# Patient Record
Sex: Female | Born: 1965 | Race: White | Hispanic: No | Marital: Married | Smoking: Never smoker
Health system: Southern US, Community
[De-identification: ages and names within clinical notes are randomized; demographics above are authoritative.]

## PROBLEM LIST (undated history)

## (undated) DIAGNOSIS — M722 Plantar fascial fibromatosis: Secondary | ICD-10-CM

## (undated) DIAGNOSIS — T884XXA Failed or difficult intubation, initial encounter: Secondary | ICD-10-CM

## (undated) DIAGNOSIS — M2609 Other specified anomalies of jaw size: Secondary | ICD-10-CM

## (undated) DIAGNOSIS — K900 Celiac disease: Secondary | ICD-10-CM

## (undated) HISTORY — DX: Plantar fascial fibromatosis: M72.2

## (undated) HISTORY — DX: Celiac disease: K90.0

## (undated) HISTORY — PX: OTHER SURGICAL HISTORY: SHX169

---

## 1999-01-04 ENCOUNTER — Other Ambulatory Visit: Admission: RE | Admit: 1999-01-04 | Discharge: 1999-01-04 | Payer: Self-pay | Admitting: Obstetrics and Gynecology

## 2000-05-22 ENCOUNTER — Other Ambulatory Visit: Admission: RE | Admit: 2000-05-22 | Discharge: 2000-05-22 | Payer: Self-pay | Admitting: Obstetrics and Gynecology

## 2001-04-24 ENCOUNTER — Other Ambulatory Visit: Admission: RE | Admit: 2001-04-24 | Discharge: 2001-04-24 | Payer: Self-pay | Admitting: Obstetrics and Gynecology

## 2001-11-20 ENCOUNTER — Inpatient Hospital Stay (HOSPITAL_COMMUNITY): Admission: AD | Admit: 2001-11-20 | Discharge: 2001-11-22 | Payer: Self-pay | Admitting: Obstetrics and Gynecology

## 2002-01-01 ENCOUNTER — Other Ambulatory Visit: Admission: RE | Admit: 2002-01-01 | Discharge: 2002-01-01 | Payer: Self-pay | Admitting: Obstetrics and Gynecology

## 2002-12-26 ENCOUNTER — Encounter: Admission: RE | Admit: 2002-12-26 | Discharge: 2002-12-26 | Payer: Self-pay | Admitting: Otolaryngology

## 2002-12-26 ENCOUNTER — Encounter: Payer: Self-pay | Admitting: Otolaryngology

## 2003-01-05 ENCOUNTER — Other Ambulatory Visit: Admission: RE | Admit: 2003-01-05 | Discharge: 2003-01-05 | Payer: Self-pay | Admitting: Obstetrics and Gynecology

## 2003-01-08 ENCOUNTER — Encounter (INDEPENDENT_AMBULATORY_CARE_PROVIDER_SITE_OTHER): Payer: Self-pay | Admitting: Specialist

## 2003-01-08 ENCOUNTER — Ambulatory Visit (HOSPITAL_BASED_OUTPATIENT_CLINIC_OR_DEPARTMENT_OTHER): Admission: RE | Admit: 2003-01-08 | Discharge: 2003-01-09 | Payer: Self-pay | Admitting: Otolaryngology

## 2004-01-13 ENCOUNTER — Other Ambulatory Visit: Admission: RE | Admit: 2004-01-13 | Discharge: 2004-01-13 | Payer: Self-pay | Admitting: Obstetrics and Gynecology

## 2004-12-12 ENCOUNTER — Other Ambulatory Visit: Admission: RE | Admit: 2004-12-12 | Discharge: 2004-12-12 | Payer: Self-pay | Admitting: Family Medicine

## 2006-02-02 ENCOUNTER — Other Ambulatory Visit: Admission: RE | Admit: 2006-02-02 | Discharge: 2006-02-02 | Payer: Self-pay | Admitting: Family Medicine

## 2007-04-12 ENCOUNTER — Other Ambulatory Visit: Admission: RE | Admit: 2007-04-12 | Discharge: 2007-04-12 | Payer: Self-pay | Admitting: Family Medicine

## 2008-07-20 ENCOUNTER — Other Ambulatory Visit: Admission: RE | Admit: 2008-07-20 | Discharge: 2008-07-20 | Payer: Self-pay | Admitting: Family Medicine

## 2009-03-26 ENCOUNTER — Encounter: Admission: RE | Admit: 2009-03-26 | Discharge: 2009-03-26 | Payer: Self-pay | Admitting: Family Medicine

## 2009-09-27 ENCOUNTER — Other Ambulatory Visit: Admission: RE | Admit: 2009-09-27 | Discharge: 2009-09-27 | Payer: Self-pay | Admitting: Family Medicine

## 2011-04-21 NOTE — Op Note (Signed)
NAME:  Mary Conrad, Mary Conrad                            ACCOUNT NO.:  [id number]   MEDICAL RECORD NO.:  [id number]                   PATIENT TYPE:  AMB   LOCATION:  DSC                                  FACILITY:  [facility]   PHYSICIAN:  Suzanna Obey, M.D.                    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   OPERATION PERFORMED:  Left tympanomastoidectomy with canal wall up and  facial nerve monitor.   SURGEON:  Suzanna Obey, M.D.   ANESTHESIA:  General endotracheal tube.   ESTIMATED BLOOD LOSS:  Less than 10 cc.   INDICATIONS FOR PROCEDURE:  The patient is a 45 year old who has had  problems with a retraction pocket in the left attic which has been cleaned  but had a pocket that was too deep to really adequately clean and  appropriately for a number of visits and the patient now appears to need an  operation to remove this cystic sac of increasing debris.  She has also had  some decrease in her hearing.  She had a CT scan which showed evidence of a  cholesteatoma in the attic and Prussak space.  She was informed of the risks  and benefits of the procedure including bleeding, infection, facial nerve  paralysis, hearing loss, vertigo, recurrence, need for additional surgery,  and risks of the anesthetic.  All questions were answered and consent was  obtained.   DESCRIPTION OF PROCEDURE:  The patient was taken to the operating room and  placed in supine position.  After adequate general endotracheal tube  anesthesia, the facial nerve monitor was positioned and calibrated which had  low impedance.  She was prepped and draped in the usual sterile manner.  A  postauricular incision and canal were injected with 1% lidocaine with  1:100,000 epinephrine and  then 2% with 1:50,000 was injected into the canal  skin.  The attic retraction pocket was easily visualized with a lot of  debris.  The attic area was curetted to try to attempt to gain visualization  of the cholesteatoma which it went deeper than even a moderate amount of  curetting of the attic and posterior canal wall was able to visualize.  The  6 o'clock 12 o'clock incision was made with a sickle knife and then a Beaver  blade for the tympanomeatal flap which was raised into the middle ear.  The  middle ear was then opened and the chorda tympani could easily be seen.  The  incudostapedial joint was seen which was intact.  The cholesteatoma was all  up around the body of the incus and malleus head.  The mastoidectomy was  then performed dissecting down with the bone cutting burs and thinning out  the tegmen and the canal wall.  The antrum was entered and carefully  dissection was performed to not disturb the fossa incudis.  Once the antrum  was opened, you could easily see the sac of the cholesteatoma into the  mastoid cavity.  The attic area was thinned out and open so access could be  gained of the sac superiorly.  The sac was then teased out both from the  canal wall side and the mastoid side removing the cholesteatoma.  It  appeared that the malleus head as well as the incus were intact.  Movement  of the malleus manubrium showed movement of the incudostapedial joint.  Everything was irrigated and the mastoid cavity had no further evidence of  any epithelial debris.  The attic retraction area was covered with a fascia  graft that had been harvested from the temporalis muscle.  A piece of  Gelfoam was placed into the space to support the graft coverage and it was  laid down to the lateral process of the malleus and then up onto the canal  wall.  The tympanomeatal flap was then laid back down into its anatomic  position. Gelfoam was placed into the attic region from behind with  Ciprodex  and then Ciprodex soaked Gelfoam was placed on top of the tympanic membrane.  The postauricular incision was closed with interrupted 4-0 chromic and a  running 5-0 nylon.  The canal was further filled with Gelfoam and a cotton  ball and Glasscock dressing was placed.  The facial nerve monitor was never  discharged.  The patient was then awakened and brought to recovery in stable  condition.  Counts correct.                                                Suzanna Obey, M.D.    Cordelia Pen  D:  01/08/2003  T:  01/08/2003  Job:  [id number]

## 2011-04-21 NOTE — Op Note (Signed)
[facility] [place]  PatientMALIK, Mary Conrad Visit Number: [id number] MRN: [id number]          Service Type: OBS Location: 910D 9123 01 Attending Physician:  Lenoard Aden Dictated by:   Lenoard Aden, M.D. Proc. Date: 11/21/01 Admit Date:  11/20/2001                             Operative Report  INDICATIONS:                  Persistent fetal tachycardia.  PROCEDURE:                    After noting fetal tachycardia persistent 180-200 beat per minute range greater than 10 minutes, fetal vertex +4/+5 station, outlet vacuum assisted vaginal delivery with Mityvac mushroom cup with two pulls over central median episiotomy for a full-term living female. Apgars 8 and 9.  Patient and husband consented risks and benefits of operative vaginal delivery prior to procedure.  They acknowledge and wish to proceed. Therefore, delivery as noted.  No cervical or vaginal lacerations noted.  Bulb suctioning on the perineum.  Placenta delivered spontaneously and intact. Three vessel cord noted.  Estimated blood loss 500 cc.  Repair with a 3-0 Vicryl Rapide in a standard fashion.  Mother and baby doing well.  No complications. Dictated by:   Lenoard Aden, M.D. Attending Physician:  Lenoard Aden DD:  11/21/01 TD:  11/21/01 Job: [id number]

## 2011-04-21 NOTE — H&P (Signed)
[facility]  PatientSU, DUMA Visit Number: [id number] MRN: [id number]          Service Type: Attending:  Lenoard Aden, M.D. Dictated by:   Lenoard Aden, M.D. Adm. Date:  11/20/01                           History and Physical  CHIEF COMPLAINT:              Probable macrosomia, history of precipitous labor.  HISTORY OF PRESENT ILLNESS:   [age]-year-old white female, G4, P2, who presents at 39 weeks for elective induction due to aforementioned indications.  MEDICATIONS:                  Prenatal vitamins.  ALLERGIES:                    GLUTEN.  PAST OBSTETRICAL HISTORY:     SAB in 61, 6-pound 8-ounce female in 19, 5-pound 12-ounce female in 1997.  PAST MEDICAL HISTORY:         Patient has no other medical or surgical hospitalizations, history of celiac disease.  FAMILY HISTORY:               Family history of lymphedema, coronary vascular disease, insulin-dependent diabetes.  PRENATAL LABORATORY DATA:     Blood type O-positive, Rh-antibody negative, rubella immune, hepatitis B surface antigen negative, HIV nonreactive, GC and Chlamydia negative, group B strep negative.  PHYSICAL EXAMINATION:  GENERAL:                      Well-developed, well-nourished white female in no apparent distress.  HEENT:                        Normal.  LUNGS:                        Clear.  HEART:                        Regular rhythm.  ABDOMEN:                      Soft, gravid, nontender.  Estimated fetal weight 8 to 8-1/2 pounds.  PELVIC:                       Cervix is 2 to 3 cm, 50%, vertex and -1.  EXTREMITIES:                  No cords.  NEUROLOGIC:                   Exam is nonfocal.  IMPRESSION:                   1. Thirty-nine-week intrauterine pregnancy.                               2. Large for gestational age.                               3. History of precipitous labor.  PLAN:  Proceed  with induction, anticipate attempts at vaginal delivery. Dictated by:   Lenoard Aden, M.D. Attending:  Lenoard Aden, M.D. DD:  11/19/01 TD:  11/20/01 Job: [id number]

## 2011-09-07 ENCOUNTER — Other Ambulatory Visit: Payer: Self-pay | Admitting: Otolaryngology

## 2011-09-07 DIAGNOSIS — H7190 Unspecified cholesteatoma, unspecified ear: Secondary | ICD-10-CM

## 2011-09-13 ENCOUNTER — Ambulatory Visit
Admission: RE | Admit: 2011-09-13 | Discharge: 2011-09-13 | Disposition: A | Discharge status: Home / Self Care | Payer: No Typology Code available for payment source | Source: Ambulatory Visit | Attending: Otolaryngology | Admitting: Otolaryngology

## 2011-09-13 DIAGNOSIS — H7190 Unspecified cholesteatoma, unspecified ear: Secondary | ICD-10-CM

## 2011-09-13 MED ORDER — IOHEXOL 300 MG/ML  SOLN
75.0000 mL | Freq: Once | INTRAMUSCULAR | Status: AC | PRN
  Administered 2011-09-13: 75 mL via INTRAVENOUS

## 2011-11-29 ENCOUNTER — Other Ambulatory Visit: Payer: Self-pay | Admitting: Otolaryngology

## 2013-05-21 ENCOUNTER — Other Ambulatory Visit (HOSPITAL_COMMUNITY)
Admission: RE | Admit: 2013-05-21 | Discharge: 2013-05-21 | Disposition: A | Discharge status: Home / Self Care | Payer: BC Managed Care – PPO | Source: Ambulatory Visit | Attending: Family Medicine | Admitting: Family Medicine

## 2013-05-21 ENCOUNTER — Other Ambulatory Visit: Payer: Self-pay | Admitting: Family Medicine

## 2013-05-21 DIAGNOSIS — Z0000 Encounter for general adult medical examination without abnormal findings: Secondary | ICD-10-CM | POA: Insufficient documentation

## 2013-06-09 ENCOUNTER — Other Ambulatory Visit: Payer: Self-pay | Admitting: Radiology

## 2013-06-16 ENCOUNTER — Other Ambulatory Visit: Payer: Self-pay | Admitting: Radiology

## 2015-03-30 ENCOUNTER — Ambulatory Visit (INDEPENDENT_AMBULATORY_CARE_PROVIDER_SITE_OTHER): Payer: BC Managed Care – PPO | Admitting: Internal Medicine

## 2015-03-30 VITALS — BP 120/80 | HR 91 | Temp 98.3°F | Ht 66.25 in | Wt 181.4 lb

## 2015-03-30 DIAGNOSIS — K047 Periapical abscess without sinus: Secondary | ICD-10-CM

## 2015-03-30 MED ORDER — AMOXICILLIN 500 MG PO CAPS: 1000.0000 mg | ORAL_CAPSULE | Freq: Two times a day (BID) | ORAL | Status: AC

## 2015-03-30 NOTE — Progress Notes (Signed)
   Subjective:    Patient ID: Mary Conrad, female    DOB: [birth year], 49 y.o.   MRN: [id number]  This chart was scribed for Tonye Pearsonobert P Kyland No, MD by Ronney LionSuzanne Le, ED Scribe. This patient was seen in room 10 and the patient's care was started at 8:18 PM.   HPI  Chief Complaint  Patient presents with  . Sinus Pressure  . Ear Pain    Left     HPI Comments: Mary Conrad is a 49 y.o. female who presents to the [facility] complaining of left sided sinus pressure and left otalgia. She also complains of lower left dental pain. Patient has a history of left cholesteatoma and wonders if that is the cause of her symptoms. She denies SOB, nasal congestion, or sleep disturbances from the pain.   Past Medical History  Diagnosis Date  . Celiac disease    Past surgical history of repair of cholesteatoma Prior to Admission medications   Medication Sig Start Date End Date Taking? Authorizing Provider  levonorgestrel-ethinyl estradiol (SEASONALE,INTROVALE,JOLESSA) 0.15-0.03 MG tablet Take 1 tablet by mouth daily.   Yes Historical Provider, MD   Not on File  Review of Systems  HENT: Negative for congestion.   Respiratory: Negative for shortness of breath.    no history of allergic rhinitis     Objective:   Physical Exam  Constitutional: She is oriented to person, place, and time. She appears well-developed and well-nourished. No distress.  HENT:  Right Ear: External ear normal.  Nose: Nose normal.  Left tympanic membrane is very scarred from surgeries related to her cholesteatoma but there is no sign of infection or some collection of fluid The left lower molar has very tender swelling at the root and pressure with the submandibular area is also tender with slight swelling in the submandibular node. Biting down on that last molar hurts.  Eyes: Conjunctivae and EOM are normal. Pupils are equal, round, and reactive to light.  Neck: Neck supple. No thyromegaly present.    Cardiovascular: Normal rate.   Pulmonary/Chest: Effort normal.  Lymphadenopathy:    She has no cervical adenopathy.  Neurological: She is alert and oriented to person, place, and time. No cranial nerve deficit.  Psychiatric: She has a normal mood and affect.  Nursing note and vitals reviewed.  BP 120/80 mmHg  Pulse 91  Temp(Src) 98.3 F (36.8 C) (Oral)  Ht 5' 6.25" (1.683 m)  Wt 181 lb 6 oz (82.271 kg)  BMI 29.05 kg/m2  SpO2 97%        Assessment & Plan:  Dental abscess   Meds ordered this encounter  Medications  . amoxicillin (AMOXIL) 500 MG capsule    Sig: Take 2 capsules (1,000 mg total) by mouth 2 (two) times daily.    Dispense:  40 capsule    Refill:  0   She will follow-up with her dentist in the next few weeks  I have completed the patient encounter in its entirety as documented by the scribe, with editing by me where necessary. Rooney Gladwin P. Merla Richesoolittle, M.D.

## 2015-11-11 ENCOUNTER — Other Ambulatory Visit: Payer: Self-pay | Admitting: Obstetrics and Gynecology

## 2015-11-11 ENCOUNTER — Encounter (HOSPITAL_COMMUNITY): Payer: Self-pay | Admitting: *Deleted

## 2015-11-12 ENCOUNTER — Encounter (HOSPITAL_COMMUNITY)
Admission: RE | Disposition: A | Discharge status: Home / Self Care | Payer: Self-pay | Source: Ambulatory Visit | Attending: Obstetrics and Gynecology

## 2015-11-12 ENCOUNTER — Ambulatory Visit (HOSPITAL_COMMUNITY)
Admission: RE | Admit: 2015-11-12 | Discharge: 2015-11-12 | Disposition: A | Discharge status: Home / Self Care | Payer: BC Managed Care – PPO | Source: Ambulatory Visit | Attending: Obstetrics and Gynecology | Admitting: Obstetrics and Gynecology

## 2015-11-12 ENCOUNTER — Ambulatory Visit (HOSPITAL_COMMUNITY): Payer: BC Managed Care – PPO | Admitting: Anesthesiology

## 2015-11-12 ENCOUNTER — Encounter (HOSPITAL_COMMUNITY): Payer: Self-pay | Admitting: Anesthesiology

## 2015-11-12 DIAGNOSIS — N920 Excessive and frequent menstruation with regular cycle: Secondary | ICD-10-CM | POA: Diagnosis present

## 2015-11-12 HISTORY — DX: Other specified anomalies of jaw size: M26.09

## 2015-11-12 HISTORY — PX: DILITATION & CURRETTAGE/HYSTROSCOPY WITH NOVASURE ABLATION: SHX5568

## 2015-11-12 HISTORY — DX: Failed or difficult intubation, initial encounter: T88.4XXA

## 2015-11-12 LAB — CBC
HCT: 41 % (ref 36.0–46.0)
Hemoglobin: 13.8 g/dL (ref 12.0–15.0)
MCH: 30.3 pg (ref 26.0–34.0)
MCHC: 33.7 g/dL (ref 30.0–36.0)
MCV: 89.9 fL (ref 78.0–100.0)
PLATELETS: 331 10*3/uL (ref 150–400)
RBC: 4.56 MIL/uL (ref 3.87–5.11)
RDW: 13.5 % (ref 11.5–15.5)
WBC: 8.7 10*3/uL (ref 4.0–10.5)

## 2015-11-12 LAB — COMPREHENSIVE METABOLIC PANEL
ALBUMIN: 4.3 g/dL (ref 3.5–5.0)
ALT: 23 U/L (ref 14–54)
AST: 24 U/L (ref 15–41)
Alkaline Phosphatase: 79 U/L (ref 38–126)
Anion gap: 11 (ref 5–15)
BUN: 10 mg/dL (ref 6–20)
CHLORIDE: 100 mmol/L — AB (ref 101–111)
CO2: 22 mmol/L (ref 22–32)
CREATININE: 0.44 mg/dL (ref 0.44–1.00)
Calcium: 8.7 mg/dL — ABNORMAL LOW (ref 8.9–10.3)
GFR calc Af Amer: >60 mL/min (ref >60)
GLUCOSE: 86 mg/dL (ref 65–99)
Potassium: 3.7 mmol/L (ref 3.5–5.1)
Sodium: 133 mmol/L — ABNORMAL LOW (ref 135–145)
Total Bilirubin: 0.4 mg/dL (ref 0.3–1.2)
Total Protein: 7.8 g/dL (ref 6.5–8.1)

## 2015-11-12 LAB — HCG, SERUM, QUALITATIVE: Preg, Serum: NEGATIVE

## 2015-11-12 SURGERY — DILATATION & CURETTAGE/HYSTEROSCOPY WITH NOVASURE ABLATION
Anesthesia: General

## 2015-11-12 MED ORDER — SCOPOLAMINE 1 MG/3DAYS TD PT72
MEDICATED_PATCH | TRANSDERMAL | Status: AC
  Administered 2015-11-12: 1.5 mg via TRANSDERMAL
  Filled 2015-11-12: qty 1

## 2015-11-12 MED ORDER — LIDOCAINE HCL (CARDIAC) 20 MG/ML IV SOLN
INTRAVENOUS | Status: AC
  Filled 2015-11-12: qty 5

## 2015-11-12 MED ORDER — PROPOFOL 10 MG/ML IV BOLUS
INTRAVENOUS | Status: AC
Start: 2015-11-12 — End: 2015-11-12
  Filled 2015-11-12: qty 20

## 2015-11-12 MED ORDER — BUPIVACAINE HCL (PF) 0.25 % IJ SOLN
INTRAMUSCULAR | Status: DC | PRN
  Administered 2015-11-12: 20 mL

## 2015-11-12 MED ORDER — FENTANYL CITRATE (PF) 100 MCG/2ML IJ SOLN: 25.0000 ug | INTRAMUSCULAR | Status: DC | PRN

## 2015-11-12 MED ORDER — KETOROLAC TROMETHAMINE 30 MG/ML IJ SOLN
INTRAMUSCULAR | Status: DC | PRN
  Administered 2015-11-12: 30 mg via INTRAVENOUS

## 2015-11-12 MED ORDER — SODIUM CHLORIDE 0.9 % IJ SOLN
INTRAMUSCULAR | Status: AC
  Filled 2015-11-12: qty 50

## 2015-11-12 MED ORDER — CEFAZOLIN SODIUM-DEXTROSE 2-3 GM-% IV SOLR
INTRAVENOUS | Status: AC
  Filled 2015-11-12: qty 50

## 2015-11-12 MED ORDER — HYDROCODONE-IBUPROFEN 7.5-200 MG PO TABS: 1.0000 | ORAL_TABLET | Freq: Three times a day (TID) | ORAL | Status: DC | PRN

## 2015-11-12 MED ORDER — HYDROCODONE-ACETAMINOPHEN 7.5-325 MG PO TABS
1.0000 | ORAL_TABLET | Freq: Once | ORAL | Status: AC | PRN
  Administered 2015-11-12: 1 via ORAL

## 2015-11-12 MED ORDER — FENTANYL CITRATE (PF) 100 MCG/2ML IJ SOLN
INTRAMUSCULAR | Status: AC
  Filled 2015-11-12: qty 2

## 2015-11-12 MED ORDER — ONDANSETRON HCL 4 MG/2ML IJ SOLN
INTRAMUSCULAR | Status: AC
  Filled 2015-11-12: qty 2

## 2015-11-12 MED ORDER — GLYCOPYRROLATE 0.2 MG/ML IJ SOLN
INTRAMUSCULAR | Status: AC
  Filled 2015-11-12: qty 1

## 2015-11-12 MED ORDER — LACTATED RINGERS IV SOLN
INTRAVENOUS | Status: DC
  Administered 2015-11-12 (×2): via INTRAVENOUS

## 2015-11-12 MED ORDER — MIDAZOLAM HCL 5 MG/5ML IJ SOLN
INTRAMUSCULAR | Status: DC | PRN
  Administered 2015-11-12: 2 mg via INTRAVENOUS

## 2015-11-12 MED ORDER — DEXAMETHASONE SODIUM PHOSPHATE 4 MG/ML IJ SOLN
INTRAMUSCULAR | Status: DC | PRN
  Administered 2015-11-12: 10 mg via INTRAVENOUS

## 2015-11-12 MED ORDER — CHLOROPROCAINE HCL 1 % IJ SOLN
INTRAMUSCULAR | Status: AC
  Filled 2015-11-12: qty 30

## 2015-11-12 MED ORDER — LIDOCAINE HCL (CARDIAC) 20 MG/ML IV SOLN
INTRAVENOUS | Status: DC | PRN
  Administered 2015-11-12: 80 mg via INTRAVENOUS

## 2015-11-12 MED ORDER — CEFAZOLIN SODIUM-DEXTROSE 2-3 GM-% IV SOLR
2.0000 g | INTRAVENOUS | Status: AC
  Administered 2015-11-12: 2 g via INTRAVENOUS

## 2015-11-12 MED ORDER — ONDANSETRON HCL 4 MG/2ML IJ SOLN
INTRAMUSCULAR | Status: DC | PRN
  Administered 2015-11-12: 4 mg via INTRAVENOUS

## 2015-11-12 MED ORDER — METOCLOPRAMIDE HCL 5 MG/ML IJ SOLN: 10.0000 mg | Freq: Once | INTRAMUSCULAR | Status: DC | PRN

## 2015-11-12 MED ORDER — BUPIVACAINE HCL (PF) 0.25 % IJ SOLN
INTRAMUSCULAR | Status: AC
  Filled 2015-11-12: qty 30

## 2015-11-12 MED ORDER — BUPIVACAINE HCL (PF) 0.25 % IJ SOLN
INTRAMUSCULAR | Status: AC
Start: 2015-11-12 — End: 2015-11-12
  Filled 2015-11-12: qty 10

## 2015-11-12 MED ORDER — SODIUM CHLORIDE 0.9 % IR SOLN
Status: DC | PRN
  Administered 2015-11-12: 600 mL

## 2015-11-12 MED ORDER — PROPOFOL 10 MG/ML IV BOLUS
INTRAVENOUS | Status: DC | PRN
  Administered 2015-11-12: 180 mg via INTRAVENOUS

## 2015-11-12 MED ORDER — MIDAZOLAM HCL 2 MG/2ML IJ SOLN
INTRAMUSCULAR | Status: AC
  Filled 2015-11-12: qty 2

## 2015-11-12 MED ORDER — SCOPOLAMINE 1 MG/3DAYS TD PT72
1.0000 | MEDICATED_PATCH | Freq: Once | TRANSDERMAL | Status: DC
  Administered 2015-11-12: 1.5 mg via TRANSDERMAL

## 2015-11-12 MED ORDER — HYDROCODONE-ACETAMINOPHEN 7.5-325 MG PO TABS
ORAL_TABLET | ORAL | Status: AC
  Filled 2015-11-12: qty 1

## 2015-11-12 MED ORDER — MEPERIDINE HCL 25 MG/ML IJ SOLN: 6.2500 mg | INTRAMUSCULAR | Status: DC | PRN

## 2015-11-12 MED ORDER — VASOPRESSIN 20 UNIT/ML IV SOLN
INTRAVENOUS | Status: AC
  Filled 2015-11-12: qty 1

## 2015-11-12 MED ORDER — DEXAMETHASONE SODIUM PHOSPHATE 10 MG/ML IJ SOLN
INTRAMUSCULAR | Status: AC
  Filled 2015-11-12: qty 1

## 2015-11-12 MED ORDER — FENTANYL CITRATE (PF) 100 MCG/2ML IJ SOLN
INTRAMUSCULAR | Status: DC | PRN
  Administered 2015-11-12: 100 ug via INTRAVENOUS

## 2015-11-12 SURGICAL SUPPLY — 18 items
ABLATOR ENDOMETRIAL BIPOLAR (ABLATOR) ×3 IMPLANT
CATH ROBINSON RED A/P 16FR (CATHETERS) ×3 IMPLANT
CLOTH BEACON ORANGE TIMEOUT ST (SAFETY) ×3 IMPLANT
CONTAINER PREFILL 10% NBF 60ML (FORM) IMPLANT
GLOVE BIO SURGEON STRL SZ7.5 (GLOVE) ×3 IMPLANT
GLOVE BIOGEL PI IND STRL 7.0 (GLOVE) ×1 IMPLANT
GLOVE BIOGEL PI INDICATOR 7.0 (GLOVE) ×2
GOWN STRL REUS W/TWL LRG LVL3 (GOWN DISPOSABLE) ×6 IMPLANT
PACK VAGINAL MINOR WOMEN LF (CUSTOM PROCEDURE TRAY) ×3 IMPLANT
PAD OB MATERNITY 4.3X12.25 (PERSONAL CARE ITEMS) ×3 IMPLANT
PAD PREP 24X48 CUFFED NSTRL (MISCELLANEOUS) ×3 IMPLANT
SUT VIC AB 2-0 SH 27 (SUTURE) ×2
SUT VIC AB 2-0 SH 27XBRD (SUTURE) ×1 IMPLANT
SYR TB 1ML 25GX5/8 (SYRINGE) ×3 IMPLANT
TOWEL OR 17X24 6PK STRL BLUE (TOWEL DISPOSABLE) ×6 IMPLANT
TUBING AQUILEX INFLOW (TUBING) ×3 IMPLANT
TUBING AQUILEX OUTFLOW (TUBING) ×3 IMPLANT
WATER STERILE IRR 1000ML POUR (IV SOLUTION) ×3 IMPLANT

## 2015-11-12 NOTE — Transfer of Care (Signed)
Immediate Anesthesia Transfer of Care Note  Patient: Mary Conrad  Procedure(s) Performed: Procedure(s): DILATATION & CURETTAGE/HYSTEROSCOPY WITH NOVASURE ABLATION (N/A)  Patient Location: PACU  Anesthesia Type:General  Level of Consciousness: awake, alert  and oriented  Airway & Oxygen Therapy: Patient Spontanous Breathing and Patient connected to nasal cannula oxygen  Post-op Assessment: Report given to RN and Post -op Vital signs reviewed and stable  Post vital signs: Reviewed and stable  Last Vitals:  Filed Vitals:   11/12/15 1347  BP: 139/90  Pulse: 93  Temp: 36.7 C  Resp: 18    Complications: No apparent anesthesia complications

## 2015-11-12 NOTE — Anesthesia Postprocedure Evaluation (Signed)
Anesthesia Post Note  Patient: Mary Conrad  Procedure(s) Performed: Procedure(s) (LRB): DILATATION & CURETTAGE/HYSTEROSCOPY WITH NOVASURE ABLATION (N/A)  Patient location during evaluation: PACU Anesthesia Type: General Level of consciousness: awake and alert and oriented Pain management: pain level controlled Vital Signs Assessment: post-procedure vital signs reviewed and stable Respiratory status: spontaneous breathing, nonlabored ventilation and respiratory function stable Cardiovascular status: blood pressure returned to baseline and stable Postop Assessment: no signs of nausea or vomiting Anesthetic complications: no    Last Vitals:  Filed Vitals:   11/12/15 1515 11/12/15 1530  BP: 128/74 120/76  Pulse:  76  Temp:    Resp:  11    Last Pain:  Filed Vitals:   11/12/15 1540  PainSc: 2                  [name] A.

## 2015-11-12 NOTE — Discharge Instructions (Signed)

## 2015-11-12 NOTE — H&P (Signed)
NAMMorrie Sheldon:  Streeter, Jyoti                  ACCOUNT NO.:  [id number]  MEDICAL RECORD NO.:  [id number]  LOCATION:  PERIO                         FACILITY:  [facility]  PHYSICIAN:  Lenoard Adenichard J. Kaity Pitstick, M.D.DATE OF BIRTH:  1966/04/18  DATE OF ADMISSION:  11/11/2015 DATE OF DISCHARGE:                             HISTORY & PHYSICAL   CHIEF COMPLAINT:  Refractory menorrhagia.  HISTORY OF PRESENT ILLNESS:  A 49 year old white female, G4, P3, who presents for diagnostic hysteroscopy, D and C, NovaSure ablation.  The patient is scheduled for office procedure, was determined to have a difficult airway and scheduled at the hospital.  She has family history of heart disease, diabetes, hypertension, and lung and skin cancer.  PERSONAL HISTORY:  Celiac disease.  OB/GYN HISTORY:  History of vaginal delivery x3.  Miscarriage x1.  PAST SURGICAL HISTORY:  History of ear surgery.  ALLERGIES:  She has no known drug allergies.  MEDICATIONS:  Birth control pills, Flonase, and ibuprofen as needed.  PHYSICAL EXAMINATION:  GENERAL:  She is a well-developed, well- nourished, white female, in no acute distress. HEENT:  Normal. NECK:  Supple.  Full range of motion. LUNGS:  Clear. HEART:  Regular rate and rhythm. ABDOMEN:  Soft, nontender. PELVIC:  Reveals a normal sized anteflexed uterus and no adnexal masses. EXTREMITIES:  There are no cords. NEUROLOGIC:  Nonfocal. SKIN:  Intact.  IMPRESSION:  Refractory menorrhagia for definitive therapy.  PLAN:  Proceed with diagnostic hysteroscopy, D and C, NovaSure endometrial ablation, risks of anesthesia, infection, bleeding, injury to surrounding organs with possible need for repair discussed.  Delayed versus immediate complications to include bowel and bladder injury noted.  The patient acknowledges and wishes to proceed.     Lenoard Adenichard J. Antoinette Haskett, M.D.     RJT/MEDQ  D:  11/12/2015  T:  11/12/2015  Job:  [id number]

## 2015-11-12 NOTE — Anesthesia Procedure Notes (Signed)
Procedure Name: LMA Insertion Date/Time: 11/12/2015 2:38 PM Performed by: Olivia MackieAAVON, RICHARD Pre-anesthesia Checklist: Patient identified, Emergency Drugs available, Suction available and Patient being monitored Patient Re-evaluated:Patient Re-evaluated prior to inductionOxygen Delivery Method: Circle system utilized Preoxygenation: Pre-oxygenation with 100% oxygen Intubation Type: IV induction Ventilation: Mask ventilation without difficulty Grade View: Grade I Number of attempts: 1 Placement Confirmation: breath sounds checked- equal and bilateral

## 2015-11-12 NOTE — Op Note (Signed)
11/12/2015  3:06 PM  PATIENT:  Mary HeadAmanda S Conrad  49 y.o. female  PRE-OPERATIVE DIAGNOSIS:  Menorrhagia  POST-OPERATIVE DIAGNOSIS:  Menorrhagia  PROCEDURE:  Procedure(s): DILATATION & CURETTAGE/HYSTEROSCOPY WITH NOVASURE ABLATION  SURGEON:  Surgeon(s): Olivia Mackieichard Casimira Sutphin, MD  ASSISTANTS: none   ANESTHESIA:   local and general  ESTIMATED BLOOD LOSS: minimal Fluid Deficit: 60cc  DRAINS: none   LOCAL MEDICATIONS USED:  MARCAINE    and Amount: 20 ml  SPECIMEN:  Source of Specimen:  [facility]  DISPOSITION OF SPECIMEN:  PATHOLOGY  COUNTS:  YES  DICTATION #: [id number]  PLAN OF CARE: DC home  PATIENT DISPOSITION:  PACU - hemodynamically stable.

## 2015-11-12 NOTE — Anesthesia Preprocedure Evaluation (Addendum)
Anesthesia Evaluation  Patient identified by MRN, date of birth, ID band Patient awake    Reviewed: Allergy & Precautions, NPO status , Patient's Chart, lab work & pertinent test results  History of Anesthesia Complications (+) DIFFICULT AIRWAY and history of anesthetic complications  Airway Mallampati: III  TM Distance: >3 FB Neck ROM: Full    Dental no notable dental hx. (+) Teeth Intact   Pulmonary neg pulmonary ROS,    Pulmonary exam normal breath sounds clear to auscultation       Cardiovascular negative cardio ROS Normal cardiovascular exam Rhythm:Regular Rate:Normal     Neuro/Psych negative neurological ROS  negative psych ROS   GI/Hepatic Neg liver ROS, Celiac disease   Endo/Other  negative endocrine ROS  Renal/GU negative Renal ROS  negative genitourinary   Musculoskeletal negative musculoskeletal ROS (+)   Abdominal   Peds  Hematology negative hematology ROS (+)   Anesthesia Other Findings Hx/o Difficult intubation in past  Reproductive/Obstetrics Menorrhagia                           Anesthesia Physical Anesthesia Plan  ASA: II  Anesthesia Plan: General   Post-op Pain Management:    Induction: Intravenous  Airway Management Planned: LMA  Additional Equipment:   Intra-op Plan:   Post-operative Plan: Extubation in OR  Informed Consent: I have reviewed the patients History and Physical, chart, labs and discussed the procedure including the risks, benefits and alternatives for the proposed anesthesia with the patient or authorized representative who has indicated his/her understanding and acceptance.   Dental advisory given  Plan Discussed with: CRNA, Anesthesiologist and Surgeon  Anesthesia Plan Comments:         Anesthesia Quick Evaluation

## 2015-11-12 NOTE — Progress Notes (Signed)
Patient ID: Mary Conrad, female   DOB: Jan 28, 1966, 49 y.o.   MRN: [id number] Patient seen and examined. Consent witnessed and signed. No changes noted. Update completed.

## 2015-11-13 NOTE — Op Note (Signed)
NAMMorrie Sheldon:  Conrad, Mary                  ACCOUNT NO.:  [id number]  MEDICAL RECORD NO.:  [id number]  LOCATION:  [facility]                          FACILITY:  [facility]  PHYSICIAN:  Lenoard Adenichard J. Raeya Merritts, M.D.DATE OF BIRTH:  23-Nov-1966  DATE OF PROCEDURE: DATE OF DISCHARGE:  11/12/2015                              OPERATIVE REPORT   PREOPERATIVE DIAGNOSIS:  Refractory menorrhagia.  POSTOPERATIVE DIAGNOSIS:  Refractory menorrhagia.  PROCEDURE:  Diagnostic hysteroscopy, D and C, NovaSure endometrial ablation.  SURGEON:  Lenoard Adenichard J. Corri Delapaz, MD  ASSISTANT:  None.  ANESTHESIA:  General and local.  ESTIMATED BLOOD LOSS:  Less than 50 mL.  FLUID DEFICIT:  60 mL.  COMPLICATIONS:  None.  DRAINS:  None.  COUNTS:  Correct.  DISPOSITION:  The patient to recovery in good condition.  BRIEF OPERATIVE NOTE:  After being apprised of risks of anesthesia, infection, bleeding, and the surrounding organs, possible need for repair, delayed versus immediate complications to include bowel and bladder injury, possible need for repair, the patient was brought to the operating room where she was administered general anesthetic without complications, prepped and draped in usual sterile fashion. Catheterized until the bladder was empty.  Feet were placed in Yellofin stirrups.  Exam under anesthesia revealed a small anteflexed uterus.  No adnexal masses.  Dilute Marcaine solution used for used for standard paracervical block.  Cervix easily dilated up to a 19 Pratt dilator. Hysteroscope placed.  Visualization reveals a normal endometrial cavity. D and C, performed using sharp curettage in a 4-quadrant method. NovaSure device placed seated to a width of 3.8, a length of 6 initiated for 69 seconds after a negative CO2 test.  Procedure was then terminated.  NovaSure device was removed. Device was inspected, found to be intact. Revisualization in the uterine cavity reveals a well ablated cavity and no evidence of  perforation.  Patient tolerated the procedure well, awakened and transferred to recovery in good condition.     Lenoard Adenichard J. Amela Handley, M.D.     RJT/MEDQ  D:  11/12/2015  T:  11/13/2015  Job:  [id number]

## 2015-11-15 ENCOUNTER — Encounter (HOSPITAL_COMMUNITY): Payer: Self-pay | Admitting: Obstetrics and Gynecology

## 2019-01-22 ENCOUNTER — Other Ambulatory Visit (HOSPITAL_COMMUNITY)
Admission: RE | Admit: 2019-01-22 | Discharge: 2019-01-22 | Disposition: A | Discharge status: Home / Self Care | Payer: BC Managed Care – PPO | Source: Ambulatory Visit | Attending: Family Medicine | Admitting: Family Medicine

## 2019-01-22 ENCOUNTER — Other Ambulatory Visit: Payer: Self-pay | Admitting: Family Medicine

## 2019-01-22 DIAGNOSIS — Z0000 Encounter for general adult medical examination without abnormal findings: Secondary | ICD-10-CM | POA: Diagnosis present

## 2019-01-24 LAB — CYTOLOGY - PAP
DIAGNOSIS: NEGATIVE
HPV (WINDOPATH): NOT DETECTED

## 2019-01-27 ENCOUNTER — Other Ambulatory Visit: Payer: Self-pay | Admitting: Family Medicine

## 2019-01-27 DIAGNOSIS — N938 Other specified abnormal uterine and vaginal bleeding: Secondary | ICD-10-CM

## 2019-02-10 ENCOUNTER — Ambulatory Visit
Admission: RE | Admit: 2019-02-10 | Discharge: 2019-02-10 | Disposition: A | Discharge status: Home / Self Care | Payer: BC Managed Care – PPO | Source: Ambulatory Visit | Attending: Family Medicine | Admitting: Family Medicine

## 2019-02-10 DIAGNOSIS — N938 Other specified abnormal uterine and vaginal bleeding: Secondary | ICD-10-CM

## 2019-02-19 ENCOUNTER — Other Ambulatory Visit: Payer: Self-pay

## 2019-02-19 ENCOUNTER — Ambulatory Visit: Payer: BC Managed Care – PPO | Admitting: Podiatry

## 2019-02-19 ENCOUNTER — Other Ambulatory Visit: Payer: Self-pay | Admitting: Podiatry

## 2019-02-19 ENCOUNTER — Ambulatory Visit (INDEPENDENT_AMBULATORY_CARE_PROVIDER_SITE_OTHER): Payer: BC Managed Care – PPO

## 2019-02-19 ENCOUNTER — Encounter: Payer: Self-pay | Admitting: Podiatry

## 2019-02-19 VITALS — BP 98/60 | HR 74

## 2019-02-19 DIAGNOSIS — M722 Plantar fascial fibromatosis: Secondary | ICD-10-CM

## 2019-02-19 DIAGNOSIS — M79672 Pain in left foot: Secondary | ICD-10-CM

## 2019-02-19 DIAGNOSIS — M7662 Achilles tendinitis, left leg: Secondary | ICD-10-CM

## 2019-02-19 DIAGNOSIS — M79671 Pain in right foot: Secondary | ICD-10-CM

## 2019-02-19 DIAGNOSIS — M7661 Achilles tendinitis, right leg: Secondary | ICD-10-CM | POA: Diagnosis not present

## 2019-02-19 MED ORDER — TRIAMCINOLONE ACETONIDE 10 MG/ML IJ SUSP
10.0000 mg | Freq: Once | INTRAMUSCULAR | Status: AC
  Administered 2019-02-19: 10 mg

## 2019-02-19 MED ORDER — DICLOFENAC SODIUM 75 MG PO TBEC: 75.0000 mg | DELAYED_RELEASE_TABLET | Freq: Two times a day (BID) | ORAL | 2 refills | Status: DC

## 2019-02-19 NOTE — Progress Notes (Signed)
Subjective:   Patient ID: Mary Conrad, female   DOB: 53 y.o.   MRN: [id number]   HPI Patient presents with long-term history of discomfort in the plantar heel left over right that is intensified over the last few months.  States the left is worse and that she has tried to reduce her activity and has tried shoe gear modifications.  Patient does not smoke likes to be active   Review of Systems  All other systems reviewed and are negative.       Objective:  Physical Exam Vitals signs and nursing note reviewed.  Constitutional:      Appearance: She is well-developed.  Pulmonary:     Effort: Pulmonary effort is normal.  Musculoskeletal: Normal range of motion.  Skin:    General: Skin is warm.  Neurological:     Mental Status: She is alert.     Neurovascular status intact muscle strength is adequate range of motion within normal limits with patient found to have exquisite discomfort plantar aspect left heel at the insertional point tendon calcaneus with mild Achilles tendon symptoms and mild pain in the right plantar heel.  Patient was noted to have good digital perfusion well oriented x3     Assessment:  Acute plantar fasciitis left with inflammation fluid around the medial band with mild discomfort Achilles and right heel     Plan:  H&P x-rays reviewed and today I did sterile prep and injected the plantar fascial left 3 mg Kenalog 5 mg Xylocaine applied fascial brace placed on diclofenac and reappoint 2 weeks or earlier if needed.  Advised this patient on reduced activity and shoe gear modifications  X-rays indicated small spur with no indications of stress fracture arthritis

## 2019-02-19 NOTE — Patient Instructions (Signed)

## 2019-03-05 ENCOUNTER — Other Ambulatory Visit: Payer: Self-pay

## 2019-03-05 ENCOUNTER — Encounter: Payer: Self-pay | Admitting: Podiatry

## 2019-03-05 ENCOUNTER — Ambulatory Visit: Payer: BC Managed Care – PPO | Admitting: Podiatry

## 2019-03-05 VITALS — Temp 97.2°F

## 2019-03-05 DIAGNOSIS — M7661 Achilles tendinitis, right leg: Secondary | ICD-10-CM | POA: Diagnosis not present

## 2019-03-05 DIAGNOSIS — M722 Plantar fascial fibromatosis: Secondary | ICD-10-CM

## 2019-03-05 DIAGNOSIS — M7662 Achilles tendinitis, left leg: Secondary | ICD-10-CM | POA: Diagnosis not present

## 2019-03-05 NOTE — Patient Instructions (Signed)

## 2019-03-05 NOTE — Progress Notes (Signed)
Subjective:   Patient ID: Mary Conrad, female   DOB: 53 y.o.   MRN: [id number]   HPI Patient presents stating my heel feels some better but I have had recurrent episodes of this and I know I am getting need some kind of support long-term   ROS      Objective:  Physical Exam  Neurovascular status intact with patient found to have exquisite discomfort that is reduced quite a bit left plantar fascia with pain still noted upon deep palpation and a very thin heel noted upon evaluation     Assessment:  Plantar fasciitis improved but still present left     Plan:  Reviewed condition and long-term of recommended orthotics to lift up the arch take stress off the heel and try to prevent her from having further episodes.  Patient at this time is scanned for customized orthotics and will be seen back when ready.  Spent a great deal time going over with her plantar fasciitis and how to try to avoid future episodes especially with her type foot structure which I explained to her at great detail today

## 2019-03-27 ENCOUNTER — Ambulatory Visit: Payer: BC Managed Care – PPO | Admitting: Orthotics

## 2019-03-27 ENCOUNTER — Other Ambulatory Visit: Payer: Self-pay

## 2019-03-27 VITALS — Temp 97.1°F

## 2019-03-27 DIAGNOSIS — M7661 Achilles tendinitis, right leg: Secondary | ICD-10-CM

## 2019-03-27 DIAGNOSIS — M7662 Achilles tendinitis, left leg: Secondary | ICD-10-CM

## 2019-03-27 DIAGNOSIS — M79671 Pain in right foot: Secondary | ICD-10-CM

## 2019-03-27 NOTE — Progress Notes (Signed)
Patient came in today to pick up custom made foot orthotics.  The goals were accomplished and the patient reported no dissatisfaction with said orthotics.  Patient was advised of breakin period and how to report any issues. 

## 2019-10-15 DIAGNOSIS — H6982 Other specified disorders of Eustachian tube, left ear: Secondary | ICD-10-CM | POA: Insufficient documentation

## 2020-05-14 ENCOUNTER — Ambulatory Visit: Payer: BC Managed Care – PPO | Admitting: Podiatry

## 2020-05-21 ENCOUNTER — Other Ambulatory Visit: Payer: Self-pay

## 2020-05-21 ENCOUNTER — Encounter: Payer: Self-pay | Admitting: Podiatry

## 2020-05-21 ENCOUNTER — Ambulatory Visit: Payer: BC Managed Care – PPO | Admitting: Podiatry

## 2020-05-21 VITALS — HR 99

## 2020-05-21 DIAGNOSIS — M722 Plantar fascial fibromatosis: Secondary | ICD-10-CM

## 2020-05-23 NOTE — Progress Notes (Signed)
Subjective:   Patient ID: Mary Conrad, female   DOB: 54 y.o.   MRN: [id number]   HPI Patient states she is doing reasonably well but still having a lot of pain in her heel and its been very bothersome especially when she gets up in the morning and after periods of sitting.  Patient states that it does get frustrated   ROS      Objective:  Physical Exam  Neurovascular status intact with patient found to have exquisite discomfort plantar aspect left heel where she is only getting approximately 2-1/2 3 months of relief with injection with reoccurrence and she admits she should have been here a long time ago and it is becoming quite an every day occurrence for her     Assessment:  Chronic with acute element of plantar fasciitis left is very painful when pressed and makes ambulation difficult     Plan:  H&P reviewed condition and discussed at great length the long-term nature of this and the consideration of 1 point in future for surgical intervention or possible shockwave.  At this point we are can continue to try conservative but it is not improving or getting need to consider something else and I did do sterile prep injected the fascia 3 mg Kenalog 5 mg Xylocaine at insertion and dispensed night splint with all instructions on usage along with aggressive ice continued orthotic usage shoe gear modifications and not going barefoot.  Reappoint 3 months for consideration of surgery if it is bad again or other possible treatment and earlier if needed

## 2020-08-26 ENCOUNTER — Ambulatory Visit: Payer: BC Managed Care – PPO | Admitting: Podiatry

## 2020-09-08 ENCOUNTER — Ambulatory Visit: Payer: BC Managed Care – PPO | Admitting: Podiatry

## 2020-09-16 ENCOUNTER — Ambulatory Visit: Payer: BC Managed Care – PPO | Admitting: Podiatry

## 2020-10-06 ENCOUNTER — Other Ambulatory Visit: Payer: Self-pay

## 2020-10-06 ENCOUNTER — Encounter: Payer: Self-pay | Admitting: Podiatry

## 2020-10-06 ENCOUNTER — Ambulatory Visit: Payer: BC Managed Care – PPO | Admitting: Podiatry

## 2020-10-06 DIAGNOSIS — M722 Plantar fascial fibromatosis: Secondary | ICD-10-CM

## 2020-10-08 NOTE — Progress Notes (Signed)
Subjective:   Patient ID: Mary Conrad, female   DOB: 54 y.o.   MRN: [id number]   HPI Patient presents stating that she has had chronic pain in her heel and it simply is not responding at this time.  States it is been present for a long time and is very tender with palpation   ROS      Objective:  Physical Exam  Acute plantar fasciitis left with inflammation fluid and pain of the medial and central band of the fascia at insertion calcaneus with a narrow heel noted and elongated foot structure with diminished fat pad     Assessment:  Acute plantar fasciitis which has not responded so far conservatively     Plan:  H&P reviewed more aggressive alternatives which could be considered.  Discussed endoscopic fasciotomy versus shockwave and discussed the advantages of both.  At this point she is can opt for shockwave and I did educate her on this and the treatment process and she will begin treatments for this.  I also went ahead today and I dispensed air fracture walker as I want her to start wearing it now to calm down the symptoms and then will use it after she has shockwave treatments.  She may ultimately require surgery but understands that this is the best way conservatively to try to get her better

## 2020-10-19 ENCOUNTER — Ambulatory Visit (INDEPENDENT_AMBULATORY_CARE_PROVIDER_SITE_OTHER): Payer: BC Managed Care – PPO | Admitting: *Deleted

## 2020-10-19 ENCOUNTER — Other Ambulatory Visit: Payer: Self-pay

## 2020-10-19 DIAGNOSIS — M722 Plantar fascial fibromatosis: Secondary | ICD-10-CM

## 2020-10-19 DIAGNOSIS — B351 Tinea unguium: Secondary | ICD-10-CM

## 2020-10-19 NOTE — Patient Instructions (Signed)

## 2020-10-19 NOTE — Progress Notes (Signed)
Patient presents for the 1st EPAT treatment today with complaint of plantar heel pain right. Diagnosed with plantar fasciitis by Dr. Charlsie Merles. This has been ongoing for several months. The patient has tried ice, stretching, NSAIDS and supportive shoe gear with no long term relief.   Most of the pain is located medial and central heel RIGHT.  ESWT administered and tolerated well.Treatment settings initiated at:   Energy: 15  Ended treatment session today with 3000 shocks at the following settings:   Energy: 15  Frequency: 6.0  Joules: 14.72   Reviewed post EPAT instructions. Advised to avoid ice and NSAIDs throughout the treatment process and to utilize boot or supportive shoes for at least the next 3 days.  Patient does have a CAM boot which she wears in today.  Follow up for 2nd treatment in 1-2 weeks.

## 2020-11-01 ENCOUNTER — Ambulatory Visit (INDEPENDENT_AMBULATORY_CARE_PROVIDER_SITE_OTHER): Payer: BC Managed Care – PPO | Admitting: *Deleted

## 2020-11-01 ENCOUNTER — Other Ambulatory Visit: Payer: Self-pay

## 2020-11-01 DIAGNOSIS — B351 Tinea unguium: Secondary | ICD-10-CM

## 2020-11-01 DIAGNOSIS — M722 Plantar fascial fibromatosis: Secondary | ICD-10-CM

## 2020-11-01 NOTE — Progress Notes (Signed)
Patient presents for the 2nd EPAT treatment today with complaint of plantar heel pain right. Diagnosed with plantar fasciitis by Dr. Charlsie Merles. This has been ongoing for several months. The patient has tried ice, stretching, NSAIDS and supportive shoe gear with no long term relief.   Most of the pain is located medial and central heel RIGHT. She does notice a slight improvement.  ESWT administered and tolerated well.Treatment settings initiated at:   Energy: 20  Ended treatment session today with 3000 shocks at the following settings:   Energy: 20  Frequency: 5.0  Joules: 19.62   Reviewed post EPAT instructions. Advised to avoid ice and NSAIDs throughout the treatment process and to utilize boot or supportive shoes for at least the next 3 days.  Patient does have a CAM boot which she wears in today.  Follow up for 3rd treatment in 1-2 weeks.

## 2020-11-12 ENCOUNTER — Ambulatory Visit (INDEPENDENT_AMBULATORY_CARE_PROVIDER_SITE_OTHER): Payer: BC Managed Care – PPO | Admitting: *Deleted

## 2020-11-12 ENCOUNTER — Other Ambulatory Visit: Payer: Self-pay

## 2020-11-12 DIAGNOSIS — M722 Plantar fascial fibromatosis: Secondary | ICD-10-CM

## 2020-11-12 DIAGNOSIS — B351 Tinea unguium: Secondary | ICD-10-CM

## 2020-11-12 NOTE — Progress Notes (Signed)
Patient presents for the 3rd EPAT treatment today with complaint of plantar heel pain right. Diagnosed with plantar fasciitis by Dr. Charlsie Merles. This has been ongoing for several months. The patient has tried ice, stretching, NSAIDS and supportive shoe gear with no long term relief.   Most of the pain is located medial and central heel RIGHT. She does notice improvement and has even stopped wearing her cam boot.  ESWT administered and tolerated well.Treatment settings initiated at:   Energy: 25  Ended treatment session today with 3000 shocks at the following settings:   Energy: 25  Frequency: 4.0  Joules: 24.52   Reviewed post EPAT instructions. Advised to avoid ice and NSAIDs throughout the treatment process and to utilize boot or supportive shoes for at least the next 3 days.  Patient does have a CAM boot.  Follow up for 4th treatment in 4 weeks due to holiday schedule.

## 2020-12-10 ENCOUNTER — Other Ambulatory Visit: Payer: BC Managed Care – PPO

## 2020-12-17 ENCOUNTER — Ambulatory Visit (INDEPENDENT_AMBULATORY_CARE_PROVIDER_SITE_OTHER): Payer: Self-pay

## 2020-12-17 ENCOUNTER — Other Ambulatory Visit: Payer: Self-pay

## 2020-12-17 DIAGNOSIS — M722 Plantar fascial fibromatosis: Secondary | ICD-10-CM

## 2020-12-17 DIAGNOSIS — B351 Tinea unguium: Secondary | ICD-10-CM

## 2020-12-17 NOTE — Progress Notes (Signed)
Patient presents for the 4th EPAT treatment today with complaint of plantar heel pain left. Diagnosed with plantar fasciitis by Dr. Charlsie Merles. This has been ongoing for several months. The patient has tried ice, stretching, NSAIDS and supportive shoe gear with no long term relief.   Most of the pain is located medial and central heel Left. She does notice improvement and has even stopped wearing her cam boot.  ESWT administered and tolerated well.Treatment settings initiated at:   Energy: 30 Ended treatment session today with 3000 shocks at the following settings:   Energy: 30  Frequency: 4.0  Joules: 29.43   Reviewed post EPAT instructions. Advised to avoid ice and NSAIDs throughout the treatment process and to utilize boot or supportive shoes for at least the next 3 days.  Patient does have a CAM boot.  Follow up in 2 weeks with Dr. Charlsie Merles for post treatment follow-up.

## 2020-12-31 ENCOUNTER — Ambulatory Visit: Payer: Self-pay | Admitting: Podiatry

## 2021-12-13 ENCOUNTER — Other Ambulatory Visit: Payer: Self-pay | Admitting: Family Medicine

## 2021-12-13 DIAGNOSIS — R413 Other amnesia: Secondary | ICD-10-CM

## 2022-01-05 ENCOUNTER — Other Ambulatory Visit: Payer: Self-pay

## 2022-01-05 ENCOUNTER — Ambulatory Visit
Admission: RE | Admit: 2022-01-05 | Discharge: 2022-01-05 | Disposition: A | Discharge status: Home / Self Care | Payer: BC Managed Care – PPO | Source: Ambulatory Visit | Attending: Family Medicine | Admitting: Family Medicine

## 2022-01-05 DIAGNOSIS — R413 Other amnesia: Secondary | ICD-10-CM

## 2022-01-18 ENCOUNTER — Other Ambulatory Visit: Payer: Self-pay | Admitting: *Deleted

## 2022-01-18 ENCOUNTER — Encounter: Payer: Self-pay | Admitting: *Deleted

## 2022-01-18 NOTE — Progress Notes (Signed)
[facility]  PATIENT: Mary Conrad DOB: 09/30/1966  REFERRING CLINICIAN: Laurann Montana, MD HISTORY FROM: self REASON FOR VISIT: memory loss   HISTORICAL  CHIEF COMPLAINT:  Chief Complaint  Patient presents with   Memory Loss    Rm 1 New Pt  MMSE 25    HISTORY OF PRESENT ILLNESS:  The patient presents for evaluation of memory loss which has been present over the past 9 months. Feels it has been getting worse over time. She has been repeating herself in conversations. She will talk to her husband about making plans and will ask the same question several times. Has been having trouble findings objects in her house. 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 Has done this for 20 years and recently has been struggling to do it. Her son has also noticed memory issues.  She did have COVID last May, but does not feel her memory changes correlate with this  Maternal uncle developed Alzheimer's in his 15s  TBI:  No past history of TBI Stroke:  no past history of stroke Seizures:  no past history of seizures Sleep: Takes Trazodone to help her sleep at night. Now does not have issues. Snores sometimes, has not woken up gasping for air. Mood: Took antidepressants several years ago for a brief time when her father passed away. She was started on sertraline for anxiety a few months ago. This has helped her anxiety but she hasn't noticed a difference in her memory.  Functional status:  Patient lives with her husband and daughter Cooking: Cooks dinner most nights. Misplace objects in the kitchen. Left the stove on once a couple of months ago Cleaning: No issues Shopping: No issues Driving: Has gotten lost in unfamiliar places. No issues driving to more familiar places Bills: Adalberto Ill bills with her husband. She has been struggling with this lately Medications: No issues, uses a pill box Ever left the stove on by accident?: yes Forgetting loved ones  names?: no Word finding difficulty? yes  OTHER MEDICAL CONDITIONS: Celiac's disease, anxiety   REVIEW OF SYSTEMS: Full 14 system review of systems performed and negative with exception of: memory loss  ALLERGIES: Allergies  Allergen Reactions   Benzoyl Peroxide     Other reaction(s): rash, red, swelling   Diclofenac     Pt stated, "It upset my stomach"   Gluten Meal Diarrhea and Nausea And Vomiting    I have celiac disease    HOME MEDICATIONS: Outpatient Medications Prior to Visit  Medication Sig Dispense Refill   fluticasone (FLONASE) 50 MCG/ACT nasal spray 2 sprays as needed     sertraline (ZOLOFT) 100 MG tablet Take 100 mg by mouth daily.     traZODone (DESYREL) 50 MG tablet Take 50 mg by mouth at bedtime.     traZODone (DESYREL) 50 MG tablet Take 50 mg by mouth at bedtime.     sertraline (ZOLOFT) 100 MG tablet Take 1 tablet by mouth daily.     No facility-administered medications prior to visit.    PAST MEDICAL HISTORY: Past Medical History:  Diagnosis Date   Celiac disease    Difficult intubation    Plantar fasciitis    Small jaw TOLD SHE HAS SMALL JAW    PAST SURGICAL HISTORY: Past Surgical History:  Procedure Laterality Date   cholestoma     DILITATION & CURRETTAGE/HYSTROSCOPY WITH NOVASURE ABLATION N/A 11/12/2015   Procedure: DILATATION & CURETTAGE/HYSTEROSCOPY WITH NOVASURE ABLATION;  Surgeon: Olivia Mackie, MD;  Location: [facility];  Service: Gynecology;  Laterality: N/A;    FAMILY HISTORY: Family History  Problem Relation Age of Onset   Hypertension Mother    Diabetes Mother    Hyperlipidemia Mother    Melanoma Mother    Heart disease Father    Diabetes Father    Hyperlipidemia Father    Alzheimer's disease Maternal Uncle 54   Stroke Maternal Grandmother    Hypertension Maternal Grandmother    Heart disease Maternal Grandmother    Hypertension Maternal Grandfather    Heart disease Maternal Grandfather    Cancer Maternal Grandfather    Heart  disease Paternal Grandmother    Heart disease Paternal Grandfather     SOCIAL HISTORY: Social History   Socioeconomic History   Marital status: Married    Spouse name: Cindee Lame   Number of children: 3   Years of education: Not on file   Highest education level: Bachelor's degree (e.g., BA, AB, BS)  Occupational History    Comment: teacher special Ed  Tobacco Use   Smoking status: Never   Smokeless tobacco: Never  Substance and Sexual Activity   Alcohol use: No    Alcohol/week: 0.0 standard drinks   Drug use: No   Sexual activity: Yes    Birth control/protection: Pill  Other Topics Concern   Not on file  Social History Narrative   Lives with husband   Social Determinants of Health   Financial Resource Strain: Not on file  Food Insecurity: Not on file  Transportation Needs: Not on file  Physical Activity: Not on file  Stress: Not on file  Social Connections: Not on file  Intimate Partner Violence: Not on file     PHYSICAL EXAM  GENERAL EXAM/CONSTITUTIONAL: Vitals:  Vitals:   01/19/22 0816  BP: 117/75  Pulse: 75  Weight: 175 lb 9.6 oz (79.7 kg)  Height: 5\' 8"  (1.727 m)   Body mass index is 26.7 kg/m. Wt Readings from Last 3 Encounters:  01/19/22 175 lb 9.6 oz (79.7 kg)  11/11/15 180 lb (81.6 kg)  03/30/15 181 lb 6 oz (82.3 kg)   Patient is in no distress; well developed, nourished and groomed; neck is supple  CARDIOVASCULAR: Examination of peripheral vascular system by observation and palpation is normal  EYES: Pupils round and reactive to light, Visual fields full to confrontation, Extraocular movements intacts,   MUSCULOSKELETAL: Gait, strength, tone, movements noted in Neurologic exam below  NEUROLOGIC: MENTAL STATUS:  MMSE - Mini Mental State Exam 01/19/2022  Orientation to time 5  Orientation to Place 5  Registration 3  Attention/ Calculation 2  Recall 2  Language- name 2 objects 2  Language- repeat 0  Language- follow 3 step command 3   Language- read & follow direction 1  Write a sentence 1  Copy design 1  Total score 25    CRANIAL NERVE:  2nd, 3rd, 4th, 6th - pupils equal and reactive to light, visual fields full to confrontation, extraocular muscles intact, no nystagmus 5th - facial sensation symmetric 7th - facial strength symmetric 8th - hearing intact 9th - palate elevates symmetrically, uvula midline 11th - shoulder shrug symmetric 12th - tongue protrusion midline  MOTOR:  normal bulk and tone, no cogwheeling, full strength in the BUE, BLE  SENSORY:  normal and symmetric to light touch all 4 extremities  COORDINATION:  finger-nose-finger intact bilaterally  REFLEXES:  deep tendon reflexes present and symmetric  GAIT/STATION:  normal     DIAGNOSTIC DATA (LABS, IMAGING, TESTING) -  I reviewed patient records, labs, notes, testing and imaging myself where available.  Lab Results  Component Value Date   WBC 8.7 11/12/2015   HGB 13.8 11/12/2015   HCT 41.0 11/12/2015   MCV 89.9 11/12/2015   PLT 331 11/12/2015      Component Value Date/Time   NA 133 (L) 11/12/2015 1350   K 3.7 11/12/2015 1350   CL 100 (L) 11/12/2015 1350   CO2 22 11/12/2015 1350   GLUCOSE 86 11/12/2015 1350   BUN 10 11/12/2015 1350   CREATININE 0.44 11/12/2015 1350   CALCIUM 8.7 (L) 11/12/2015 1350   PROT 7.8 11/12/2015 1350   ALBUMIN 4.3 11/12/2015 1350   AST 24 11/12/2015 1350   ALT 23 11/12/2015 1350   ALKPHOS 79 11/12/2015 1350   BILITOT 0.4 11/12/2015 1350   GFRNONAA >60 11/12/2015 1350   GFRAA >60 11/12/2015 1350   03/2021 TSH 1.8  MRI brain 01/05/22: Unremarkable, normal cerebral volume and no significant white matter disease. Mild mucosal thickening bilaterally   ASSESSMENT AND PLAN  56 y.o. year old female with a history of Celiac's disease and anxiety who presents for evaluation of memory loss over the past 9 months. MRI brain showed normal cerebral volume and no significant microvascular changes. Her  MMSE today is 25/30, within normal limits. She has a history of anxiety but denies current issues with anxiety or with sleep. Will refer for neuropsychological testing to better characterize her deficits as memory issues have begun impacting her daily life and she has a history of early onset Alzheimer's disease in the family.  She also endorses pain in multiple joints and rashes on her chest and neck. Is concerned she may have psoriatic arthritis or another autoimmune disease. Referral to Rheumatology placed at request of patient.   1. Memory loss   2. Arthralgia, unspecified joint       PLAN: - Labs: B12 - Referral for neuropsychological testing  - Referral to Rheumatology for joint pain and rashes - Follow up after testing is complete.   Orders Placed This Encounter  Procedures   Vitamin B12   Ambulatory referral to Neuropsychology   Ambulatory referral to Rheumatology    Return after testing.  I spent an average of 29 minutes chart reviewing and counseling the patient, with at least 50% of the time face to face with the patient.   Ocie Doyne, MD 01/19/22 9:02 AM  [facility] [address], [address] [place], [place] [address] [phone]'

## 2022-01-19 ENCOUNTER — Encounter: Payer: Self-pay | Admitting: Psychiatry

## 2022-01-19 ENCOUNTER — Ambulatory Visit: Payer: BC Managed Care – PPO | Admitting: Psychiatry

## 2022-01-19 ENCOUNTER — Telehealth: Payer: Self-pay | Admitting: Psychiatry

## 2022-01-19 VITALS — BP 117/75 | HR 75 | Ht 68.0 in | Wt 175.6 lb

## 2022-01-19 DIAGNOSIS — M2550 Pain in unspecified joint: Secondary | ICD-10-CM | POA: Diagnosis not present

## 2022-01-19 DIAGNOSIS — R413 Other amnesia: Secondary | ICD-10-CM

## 2022-01-19 NOTE — Telephone Encounter (Signed)
Referral sent to [facility] [phone] 

## 2022-01-19 NOTE — Patient Instructions (Addendum)
Blood work - vitamin B12 level Referral for neuropsychological testing Referral to rheumatology for joint pain  Tasks to improve attention/working memory 1. Good sleep hygiene (7-8 hrs of sleep) 2. Learning a new skill (Painting, Carpentry, Pottery, new language, Knitting). 3.Cognitive exercises (keep a daily journal, Puzzles) 4. Physical exercise and training  (30 min/day X 4 days week) 5. Being on Antidepressant if needed 6.Yoga, Meditation, Tai Chi 7. Decrease alcohol intake 8.Have a clear schedule and structure in daily routine

## 2022-01-20 LAB — VITAMIN B12: Vitamin B-12: 347 pg/mL (ref 232–1245)

## 2022-05-02 ENCOUNTER — Telehealth: Payer: Self-pay | Admitting: Psychiatry

## 2022-05-02 NOTE — Telephone Encounter (Signed)
Patient lvm stating that she never heard anything about her neuropsychology referral Dr. Billey Gosling placed in Feb. And would like a call back

## 2022-05-09 ENCOUNTER — Telehealth: Payer: Self-pay | Admitting: Psychiatry

## 2022-05-09 NOTE — Telephone Encounter (Signed)
Pt called stating that she has yet to hear from [facility]. She has called several time and has been sent to [facility] and has not received a call back. This has been going on since Feb. Pt would like to know if a referral can be sent to another location. Please advise.

## 2022-05-15 NOTE — Telephone Encounter (Signed)
Pts daughter calling back to f/u on referral. Pt has not heard back from [facility] and at this time would like referral sent somewhere else. Best call back number is [phone].

## 2022-06-12 NOTE — Telephone Encounter (Signed)
error 

## 2023-04-28 IMAGING — MR MR HEAD W/O CM
10 series · 48 of 48 positions shown · non-contrast
Comparison: CT of the temporal bones 09/13/2011.

CLINICAL DATA: Memory difficulty. Additional history provided by
scanning technologist: Patient reports issues with memory for 1
year.

EXAM:
MRI HEAD WITHOUT CONTRAST
TECHNIQUE: Multiplanar, multiecho pulse sequences of the brain and surrounding
structures were obtained without intravenous contrast.

[Series 2: T1 · sagittal · 5.0mm · 0.45mm/px · 3 of 21 slices shown]
[im 1/21]
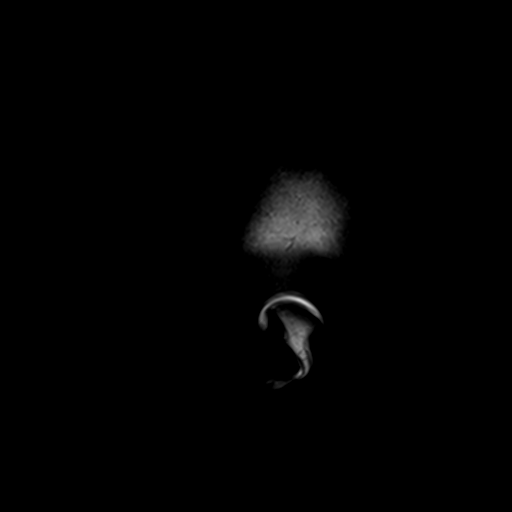
[im 11/21]
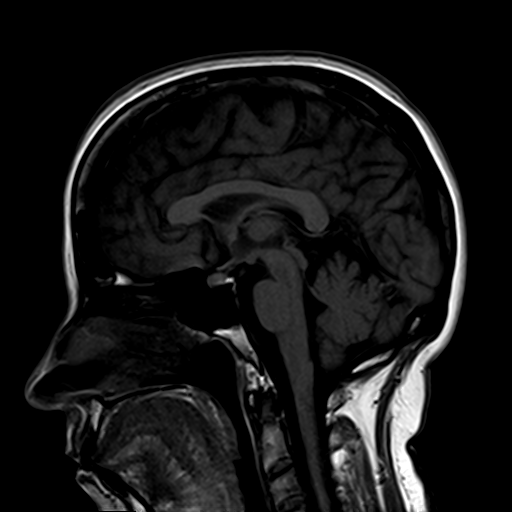
[im 21/21]
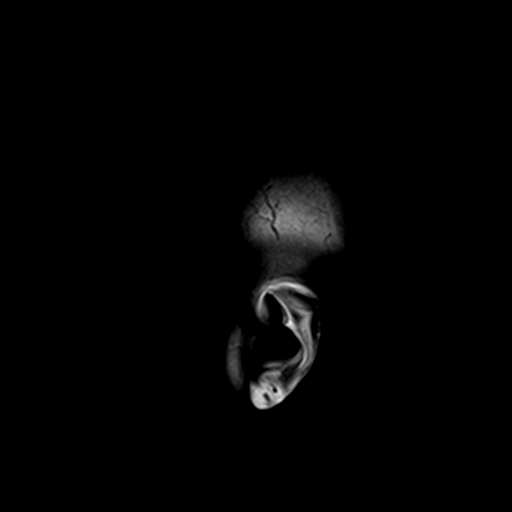

[Series 3: DWI · axial · 3.0mm · 1.80mm/px · z∈[-59,+88]mm · 9 of 100 slices shown (1 of 4)]
[im 1/100]
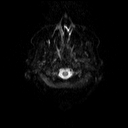
[im 13/100]
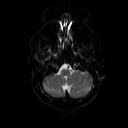
[im 25/100]
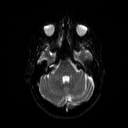
[im 38/100]
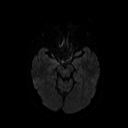
[im 50/100]
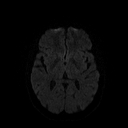
[im 62/100]
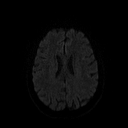
[im 75/100]
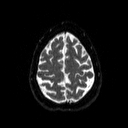
[im 87/100]
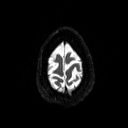
[im 100/100]
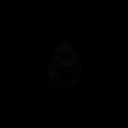

[Series 4: DWI · axial · 3.0mm · 1.80mm/px · z∈[-59,+88]mm · 4 of 46 slices shown (2 of 4)]
[im 1/46]
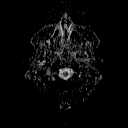
[im 16/46]
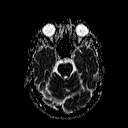
[im 31/46]
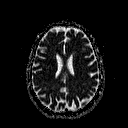
[im 46/46]
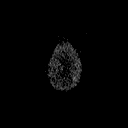

[Series 5: DWI · coronal · 5.0mm · 1.80mm/px · 6 of 68 slices shown (3 of 4)]
[im 1/68]
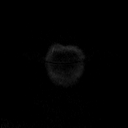
[im 14/68]
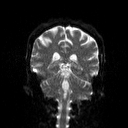
[im 27/68]
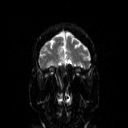
[im 41/68]
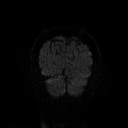
[im 54/68]
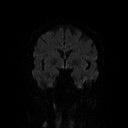
[im 68/68]
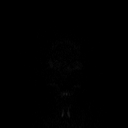

[Series 6: DWI · coronal · 5.0mm · 1.80mm/px · 3 of 35 slices shown (4 of 4)]
[im 1/35]
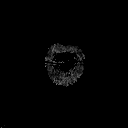
[im 18/35]
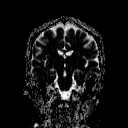
[im 35/35]
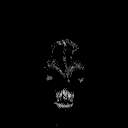

[Series 7: T2 · axial · 5.0mm · 0.51mm/px · z∈[-63,+84]mm · 2 of 22 slices shown (1 of 2)]
[im 1/22]
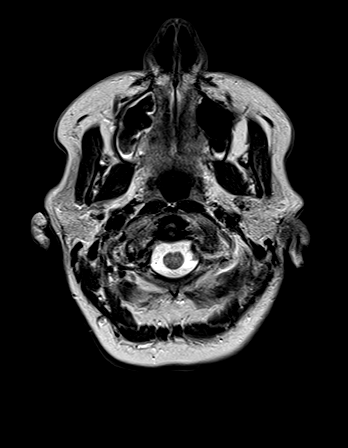
[im 22/22]
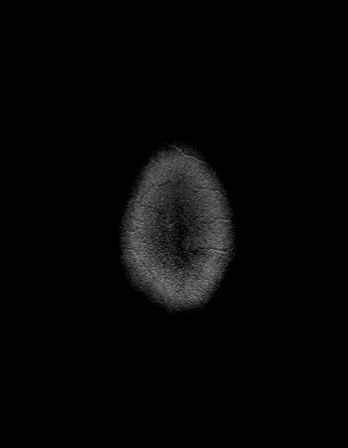

[Series 8: FLAIR · axial · 3.0mm · 0.45mm/px · z∈[-57,+78]mm · 3 of 30 slices shown]
[im 1/30]
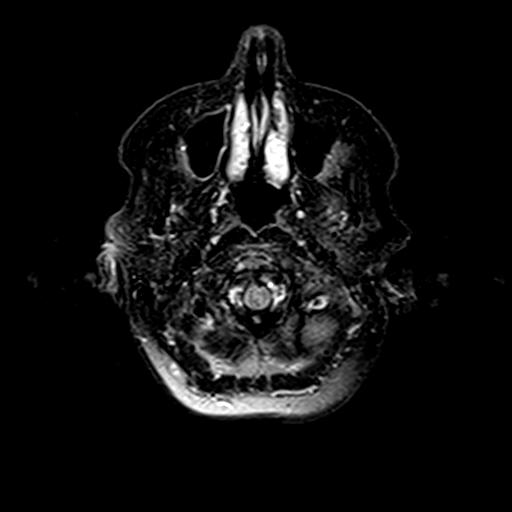
[im 15/30]
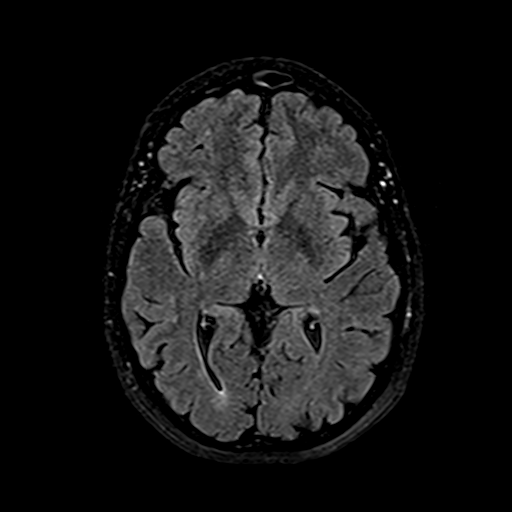
[im 30/30]
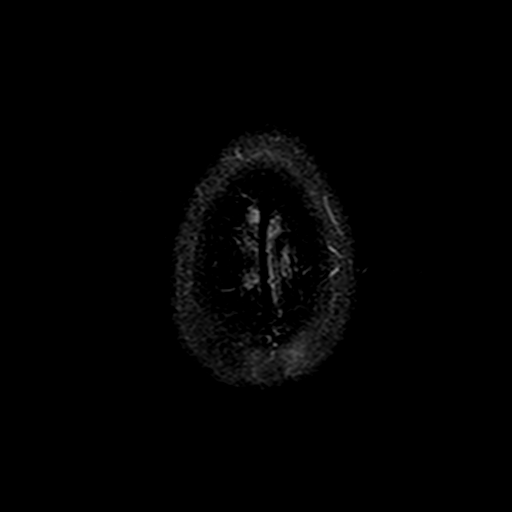

[Series 10: swi_images · axial · 4.0mm · 0.90mm/px · z∈[-59,+81]mm · 3 of 36 slices shown]
[im 1/36]
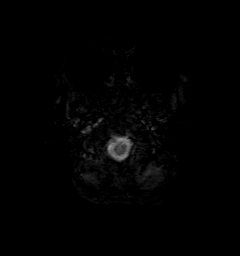
[im 18/36]
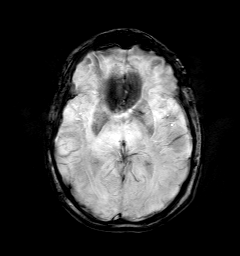
[im 36/36]
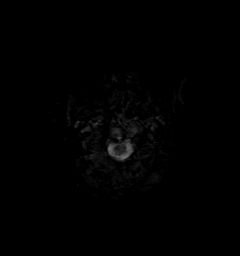

[Series 11: t1_mpr_tra · axial · 1.0mm · 0.71mm/px · z∈[-61,+82]mm · 13 of 144 slices shown]
[im 1/144]
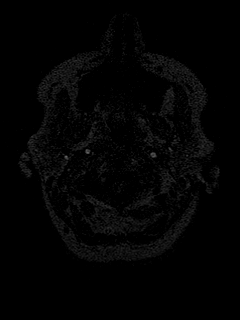
[im 12/144]
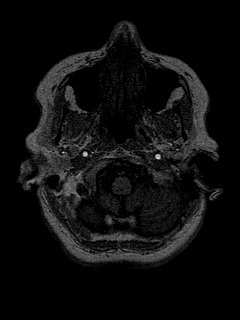
[im 24/144]
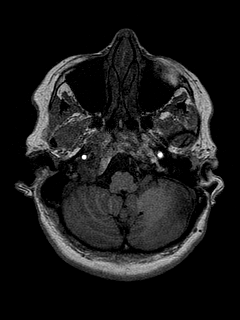
[im 36/144]
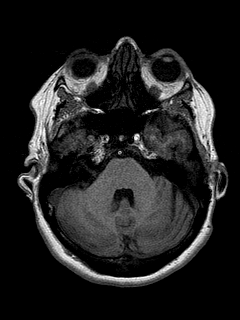
[im 48/144]
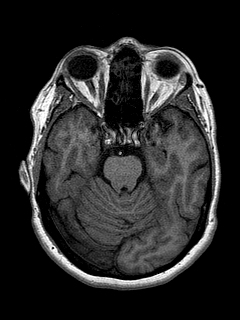
[im 60/144]
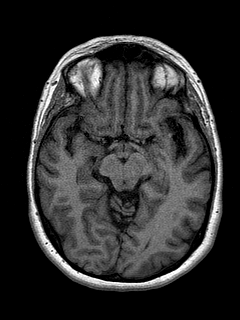
[im 72/144]
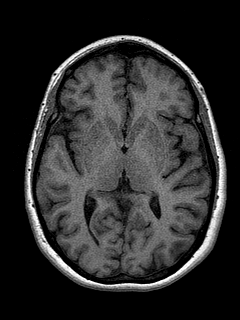
[im 84/144]
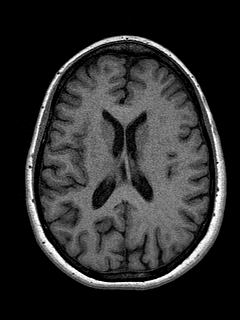
[im 96/144]
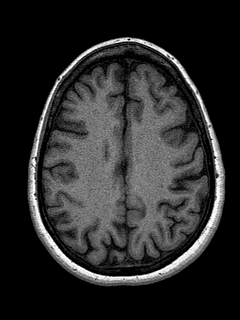
[im 108/144]
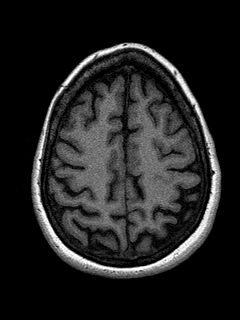
[im 120/144]
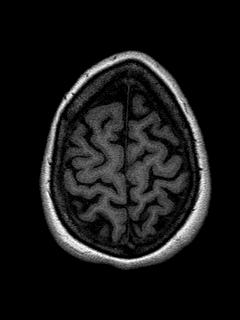
[im 132/144]
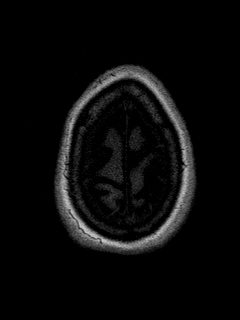
[im 144/144]
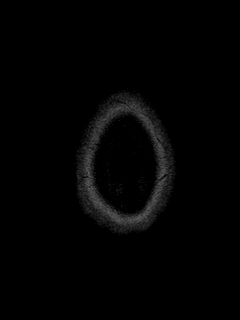

[Series 12: T2 · coronal · 5.0mm · 0.45mm/px · 2 of 27 slices shown (2 of 2)]
[im 1/27]
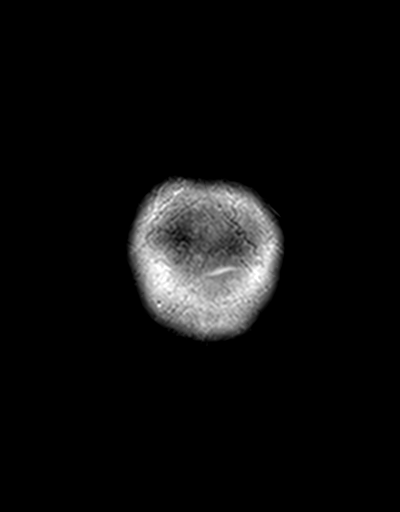
[im 27/27]
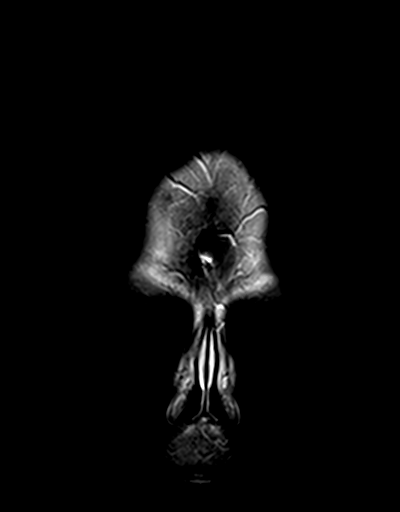

[48 of 48 positions shown; findings below may reference images not displayed]

FINDINGS: Brain:

Cerebral volume appears normal for age.

No cortical encephalomalacia is identified. No significant cerebral
white matter disease.

There is no acute infarct.

No evidence of an intracranial mass.

No chronic intracranial blood products.

No extra-axial fluid collection.

No midline shift.

Vascular: Maintained flow voids within the proximal large arterial
vessels.

Skull and upper cervical spine: No focal suspicious marrow lesion.

Sinuses/Orbits: Visualized orbits show no acute finding. Mild
mucosal thickening within the left frontal sinus. Mild mucosal
thickening and frothy secretions within the bilateral ethmoid
sinuses. Mild mucosal thickening within the right maxillary sinus.

Other: Prior left mastoidectomy. Trace fluid within remaining left
mastoid air cells.
IMPRESSION: Unremarkable non-contrast MRI appearance of the brain for age. No
evidence of acute intracranial abnormality.

Paranasal sinus disease, as described.

Prior left mastoidectomy. Trace fluid within remaining left mastoid
air cells.

## 2024-06-19 ENCOUNTER — Ambulatory Visit (INDEPENDENT_AMBULATORY_CARE_PROVIDER_SITE_OTHER)

## 2024-06-19 ENCOUNTER — Ambulatory Visit: Admitting: Podiatry

## 2024-06-19 ENCOUNTER — Ambulatory Visit

## 2024-06-19 ENCOUNTER — Encounter: Payer: Self-pay | Admitting: Podiatry

## 2024-06-19 DIAGNOSIS — M722 Plantar fascial fibromatosis: Secondary | ICD-10-CM

## 2024-06-19 MED ORDER — DICLOFENAC SODIUM 75 MG PO TBEC: 75.0000 mg | DELAYED_RELEASE_TABLET | Freq: Two times a day (BID) | ORAL | 2 refills | Status: AC

## 2024-06-19 MED ORDER — TRIAMCINOLONE ACETONIDE 10 MG/ML IJ SUSP
10.0000 mg | Freq: Once | INTRAMUSCULAR | Status: AC
  Administered 2024-06-19: 10 mg via INTRA_ARTICULAR

## 2024-06-19 NOTE — Patient Instructions (Signed)

## 2024-06-19 NOTE — Progress Notes (Signed)
 Subjective:   Patient ID: Mary Conrad Free, female   DOB: 58 y.o.   MRN: [id number]   HPI Patient states she has had reoccurrence of severe pain in the right heel and states that it did get better for several years after shockwave therapy but it is back again.  She has been diagnosed with early onset dementia but has hopefully minimal symptoms with this.  Patient does not smoke tries to be active   Review of Systems  All other systems reviewed and are negative.       Objective:  Physical Exam Vitals and nursing note reviewed.  Constitutional:      Appearance: She is well-developed.  Pulmonary:     Effort: Pulmonary effort is normal.  Musculoskeletal:        General: Normal range of motion.  Skin:    General: Skin is warm.  Neurological:     Mental Status: She is alert.     Neurovascular status intact muscle strength found to be adequate range of motion within normal limits with exquisite discomfort noted medial fascial band right at the insertional point tendon calcaneus with fluid buildup with history of boot usage with shockwave therapy.  Good digital perfusion well-oriented     Assessment:  Acute plantar fasciitis right with inflammation fluid around the medial band     Plan:  H&P x-ray taken reviewed sterile prep injected the plantar fascia at insertion 3 mg Kenalog  5 mg Xylocaine  and it went ahead today and applied fascial brace carefully fitting this and properly.  Discussed orthotic therapy for long-term I think she would do good in these and I did place her on diclofenac  temporarily and patient states that she does okay with that.  X-rays indicate spur formation no indication stress fracture arthritis
